# Patient Record
Sex: Male | Born: 1980
Health system: Southern US, Community
[De-identification: ages and names within clinical notes are randomized; demographics above are authoritative.]

## PROBLEM LIST (undated history)

## (undated) DIAGNOSIS — Z789 Other specified health status: Secondary | ICD-10-CM

## (undated) HISTORY — PX: NO PAST SURGERIES: SHX2092

## (undated) HISTORY — PX: OTHER SURGICAL HISTORY: SHX169

---

## 2012-09-02 ENCOUNTER — Emergency Department (HOSPITAL_COMMUNITY): Payer: Self-pay

## 2012-09-02 ENCOUNTER — Observation Stay (HOSPITAL_COMMUNITY): Payer: No Typology Code available for payment source

## 2012-09-02 ENCOUNTER — Encounter (HOSPITAL_COMMUNITY): Payer: Self-pay | Admitting: Emergency Medicine

## 2012-09-02 ENCOUNTER — Observation Stay (HOSPITAL_COMMUNITY)
Admission: EM | Admit: 2012-09-02 | Discharge: 2012-09-03 | Disposition: A | Discharge status: Home / Self Care | Payer: No Typology Code available for payment source | Attending: General Surgery | Admitting: General Surgery

## 2012-09-02 DIAGNOSIS — R109 Unspecified abdominal pain: Principal | ICD-10-CM | POA: Insufficient documentation

## 2012-09-02 DIAGNOSIS — S161XXA Strain of muscle, fascia and tendon at neck level, initial encounter: Secondary | ICD-10-CM

## 2012-09-02 DIAGNOSIS — S301XXA Contusion of abdominal wall, initial encounter: Secondary | ICD-10-CM

## 2012-09-02 DIAGNOSIS — M25579 Pain in unspecified ankle and joints of unspecified foot: Secondary | ICD-10-CM | POA: Insufficient documentation

## 2012-09-02 DIAGNOSIS — R51 Headache: Secondary | ICD-10-CM | POA: Insufficient documentation

## 2012-09-02 DIAGNOSIS — R42 Dizziness and giddiness: Secondary | ICD-10-CM | POA: Insufficient documentation

## 2012-09-02 DIAGNOSIS — Z23 Encounter for immunization: Secondary | ICD-10-CM | POA: Insufficient documentation

## 2012-09-02 DIAGNOSIS — M549 Dorsalgia, unspecified: Secondary | ICD-10-CM | POA: Insufficient documentation

## 2012-09-02 DIAGNOSIS — S139XXA Sprain of joints and ligaments of unspecified parts of neck, initial encounter: Secondary | ICD-10-CM | POA: Insufficient documentation

## 2012-09-02 HISTORY — DX: Other specified health status: Z78.9

## 2012-09-02 LAB — COMPREHENSIVE METABOLIC PANEL
AST: 34 U/L (ref 0–37)
CO2: 27 mEq/L (ref 19–32)
Calcium: 9.3 mg/dL (ref 8.4–10.5)
Creatinine, Ser: 0.88 mg/dL (ref 0.50–1.35)
GFR calc Af Amer: >90 mL/min (ref >90)
GFR calc non Af Amer: >90 mL/min (ref >90)

## 2012-09-02 LAB — URINALYSIS, ROUTINE W REFLEX MICROSCOPIC
Hgb urine dipstick: NEGATIVE
Leukocytes, UA: NEGATIVE
Nitrite: NEGATIVE
Specific Gravity, Urine: 1.037 — ABNORMAL HIGH (ref 1.005–1.030)
Urobilinogen, UA: 0.2 mg/dL (ref 0.0–1.0)

## 2012-09-02 LAB — CBC WITH DIFFERENTIAL/PLATELET
Basophils Absolute: 0 10*3/uL (ref 0.0–0.1)
Basophils Relative: 1 % (ref 0–1)
Eosinophils Relative: 1 % (ref 0–5)
HCT: 43.8 % (ref 39.0–52.0)
Lymphocytes Relative: 23 % (ref 12–46)
MCHC: 35.8 g/dL (ref 30.0–36.0)
MCV: 77.8 fL — ABNORMAL LOW (ref 78.0–100.0)
Monocytes Absolute: 0.6 10*3/uL (ref 0.1–1.0)
RDW: 13.7 % (ref 11.5–15.5)

## 2012-09-02 MED ORDER — OXYCODONE HCL 5 MG PO TABS: 10.0000 mg | ORAL_TABLET | ORAL | Status: DC | PRN

## 2012-09-02 MED ORDER — ONDANSETRON HCL 4 MG/2ML IJ SOLN
4.0000 mg | Freq: Once | INTRAMUSCULAR | Status: AC
  Administered 2012-09-02: 4 mg via INTRAVENOUS
  Filled 2012-09-02: qty 2

## 2012-09-02 MED ORDER — DEXTROSE-NACL 5-0.45 % IV SOLN
INTRAVENOUS | Status: DC
  Administered 2012-09-02 – 2012-09-03 (×2): via INTRAVENOUS

## 2012-09-02 MED ORDER — INFLUENZA VIRUS VACC SPLIT PF IM SUSP
0.5000 mL | INTRAMUSCULAR | Status: AC
  Administered 2012-09-03: 0.5 mL via INTRAMUSCULAR
  Filled 2012-09-02: qty 0.5

## 2012-09-02 MED ORDER — SODIUM CHLORIDE 0.9 % IV SOLN
Freq: Once | INTRAVENOUS | Status: AC
  Administered 2012-09-02: 16:00:00 via INTRAVENOUS

## 2012-09-02 MED ORDER — ONDANSETRON HCL 8 MG PO TABS
4.0000 mg | ORAL_TABLET | Freq: Four times a day (QID) | ORAL | Status: DC | PRN
  Filled 2012-09-02: qty 0.5

## 2012-09-02 MED ORDER — HYDROMORPHONE HCL PF 1 MG/ML IJ SOLN
1.0000 mg | INTRAMUSCULAR | Status: DC | PRN
  Administered 2012-09-02: 1 mg via INTRAVENOUS

## 2012-09-02 MED ORDER — HYDROMORPHONE HCL PF 1 MG/ML IJ SOLN
INTRAMUSCULAR | Status: AC
  Filled 2012-09-02: qty 1

## 2012-09-02 MED ORDER — ONDANSETRON HCL 4 MG/2ML IJ SOLN
4.0000 mg | Freq: Four times a day (QID) | INTRAMUSCULAR | Status: DC | PRN
  Administered 2012-09-02: 4 mg via INTRAVENOUS

## 2012-09-02 MED ORDER — KCL IN DEXTROSE-NACL 20-5-0.9 MEQ/L-%-% IV SOLN
INTRAVENOUS | Status: DC
  Filled 2012-09-02 (×2): qty 1000

## 2012-09-02 MED ORDER — IOHEXOL 300 MG/ML  SOLN
100.0000 mL | Freq: Once | INTRAMUSCULAR | Status: AC | PRN
  Administered 2012-09-02: 100 mL via INTRAVENOUS

## 2012-09-02 MED ORDER — ONDANSETRON HCL 4 MG/2ML IJ SOLN
INTRAMUSCULAR | Status: AC
  Filled 2012-09-02: qty 2

## 2012-09-02 MED ORDER — MORPHINE SULFATE 4 MG/ML IJ SOLN
4.0000 mg | Freq: Once | INTRAMUSCULAR | Status: AC
  Administered 2012-09-02: 4 mg via INTRAVENOUS
  Filled 2012-09-02: qty 1
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
## 2012-09-03 MED ORDER — HYDROCODONE-ACETAMINOPHEN 5-325 MG PO TABS: 1.0000 | ORAL_TABLET | Freq: Four times a day (QID) | ORAL | Status: DC | PRN

## 2012-09-03 MED ORDER — PROMETHAZINE HCL 12.5 MG PO TABS: 25.0000 mg | ORAL_TABLET | Freq: Four times a day (QID) | ORAL | Status: DC | PRN

## 2012-09-03 NOTE — Progress Notes (Signed)
Patient discharged to home with friend.  Discharge teaching completed with friend interpreting, including follow up care, and medications.  Verbalizes understanding with no further questions.  Vital signs stable, no complaints of pain.  Discharged per wheelchair with friend.
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
## 2012-09-03 NOTE — Discharge Summary (Signed)
Patient is stable for discharge.  This patient has been seen and I agree with the findings and treatment plan.  Marta Lamas. Gae Bon, MD, FACS [phone] (pager) [phone] (direct pager) Trauma Surgeon

## 2015-08-22 DIAGNOSIS — M549 Dorsalgia, unspecified: Secondary | ICD-10-CM | POA: Insufficient documentation

## 2016-01-12 ENCOUNTER — Encounter (HOSPITAL_BASED_OUTPATIENT_CLINIC_OR_DEPARTMENT_OTHER): Payer: Self-pay | Admitting: *Deleted

## 2016-01-12 ENCOUNTER — Emergency Department (HOSPITAL_BASED_OUTPATIENT_CLINIC_OR_DEPARTMENT_OTHER)
Admission: EM | Admit: 2016-01-12 | Discharge: 2016-01-12 | Disposition: A | Discharge status: Home / Self Care | Payer: No Typology Code available for payment source | Attending: Emergency Medicine | Admitting: Emergency Medicine

## 2016-01-12 DIAGNOSIS — J309 Allergic rhinitis, unspecified: Secondary | ICD-10-CM | POA: Insufficient documentation

## 2016-01-12 DIAGNOSIS — H1013 Acute atopic conjunctivitis, bilateral: Secondary | ICD-10-CM | POA: Insufficient documentation

## 2016-01-12 MED ORDER — OLOPATADINE HCL 0.2 % OP SOLN: 1.0000 [drp] | Freq: Every day | OPHTHALMIC | Status: DC

## 2016-01-12 MED ORDER — FLUTICASONE PROPIONATE 50 MCG/ACT NA SUSP: 2.0000 | Freq: Every day | NASAL | Status: DC

## 2016-01-12 MED FILL — SM ALLERGY RELIEF 50 MCG SP: 50 MCG | 30 days supply | Qty: 16 | Fill #0

## 2016-01-12 MED FILL — PATADAY 0.2% EYE DROPS: 0.2 | 25 days supply | Qty: 3 | Fill #0

## 2016-01-12 NOTE — Discharge Instructions (Signed)
Allergic Conjunctivitis  Allergic conjunctivitis is inflammation of the clear membrane that covers the white part of your eye and the inner surface of your eyelid (conjunctiva), and it is caused by allergies. The blood vessels in the conjunctiva become inflamed, and this causes the eye to become red or pink, and it often causes itchiness in the eye. Allergic conjunctivitis cannot be spread by one person to another person (noncontagious).  CAUSES  This condition is caused by an allergic reaction. Common causes of an allergic reaction (allergens) include:  · Dust.  · Pollen.  · Mold.  · Animal dander or secretions.  RISK FACTORS  This condition is more likely to develop if you are exposed to high levels of allergens that cause the allergic reaction. This might include being outdoors when air pollen levels are high or being around animals that you are allergic to.  SYMPTOMS  Symptoms of this condition may include:  · Eye redness.  · Tearing of the eyes.  · Watery eyes.  · Itchy eyes.  · Burning feeling in the eyes.  · Clear drainage from the eyes.  · Swollen eyelids.  DIAGNOSIS  This condition may be diagnosed by medical history and physical exam. If you have drainage from your eyes, it may be tested to rule out other causes of conjunctivitis.  TREATMENT  Treatment for this condition often includes medicines. These may be eye drops, ointments, or oral medicines. They may be prescription medicines or over-the-counter medicines.  HOME CARE INSTRUCTIONS  · Take or apply medicines only as directed by your health care provider.  · Do not touch or rub your eyes.  · Do not wear contact lenses until the inflammation is gone. Wear glasses instead.  · Do not wear eye makeup until the inflammation is gone.  · Apply a cool, clean washcloth to your eye for 10-20 minutes, 3-4 times a day.  · Try to avoid whatever allergen is causing the allergic reaction.  SEEK MEDICAL CARE IF:  · Your symptoms get worse.  · You have pus draining  from your eye.  · You have new symptoms.  · You have a fever.     This information is not intended to replace advice given to you by your health care provider. Make sure you discuss any questions you have with your health care provider.     Document Released: 12/10/2002 Document Revised: 10/10/2014 Document Reviewed: 07/01/2014  Elsevier Interactive Patient Education ©2016 [employer]    Allergic Rhinitis  Allergic rhinitis is when the mucous membranes in the nose respond to allergens. Allergens are particles in the air that cause your body to have an allergic reaction. This causes you to release allergic antibodies. Through a chain of events, these eventually cause you to release histamine into the blood stream. Although meant to protect the body, it is this release of histamine that causes your discomfort, such as frequent sneezing, congestion, and an itchy, runny nose.   CAUSES  Seasonal allergic rhinitis (hay fever) is caused by pollen allergens that may come from grasses, trees, and weeds. Year-round allergic rhinitis (perennial allergic rhinitis) is caused by allergens such as house dust mites, pet dander, and mold spores.  SYMPTOMS  · Nasal stuffiness (congestion).  · Itchy, runny nose with sneezing and tearing of the eyes.  DIAGNOSIS  Your health care provider can help you determine the allergen or allergens that trigger your symptoms. If you and your health care provider are unable to determine the allergen,   skin or blood testing may be used. Your health care provider will diagnose your condition after taking your health history and performing a physical exam. Your health care provider may assess you for other related conditions, such as asthma, pink eye, or an ear infection.  TREATMENT  Allergic rhinitis does not have a cure, but it can be controlled by:  · Medicines that block allergy symptoms. These may include allergy shots, nasal sprays, and oral antihistamines.  · Avoiding the allergen.  Hay fever  may often be treated with antihistamines in pill or nasal spray forms. Antihistamines block the effects of histamine. There are over-the-counter medicines that may help with nasal congestion and swelling around the eyes. Check with your health care provider before taking or giving this medicine.  If avoiding the allergen or the medicine prescribed do not work, there are many new medicines your health care provider can prescribe. Stronger medicine may be used if initial measures are ineffective. Desensitizing injections can be used if medicine and avoidance does not work. Desensitization is when a patient is given ongoing shots until the body becomes less sensitive to the allergen. Make sure you follow up with your health care provider if problems continue.  HOME CARE INSTRUCTIONS  It is not possible to completely avoid allergens, but you can reduce your symptoms by taking steps to limit your exposure to them. It helps to know exactly what you are allergic to so that you can avoid your specific triggers.  SEEK MEDICAL CARE IF:  · You have a fever.  · You develop a cough that does not stop easily (persistent).  · You have shortness of breath.  · You start wheezing.  · Symptoms interfere with normal daily activities.     This information is not intended to replace advice given to you by your health care provider. Make sure you discuss any questions you have with your health care provider.     Document Released: 06/14/2001 Document Revised: 10/10/2014 Document Reviewed: 05/27/2013  Elsevier Interactive Patient Education ©2016 [employer]

## 2016-01-12 NOTE — ED Notes (Signed)
Eyes are red and itching with drainage. Stuffy nose.

## 2016-01-12 NOTE — ED Provider Notes (Signed)
CSN: [id number]     Arrival date & time 01/12/16  1504 History   First MD Initiated Contact with Patient 01/12/16 1629     Chief Complaint  Patient presents with  . Eye Problem   HPI  Mr. Brandon Rice is a 35 year old male presenting with eye itching and drainage. Onset of symptoms was 2 weeks ago but has worsened in the past 5 days. He reports eye redness and watery drainage. He has had morning crusting. He endorses a sensation of itching behind his eyelids. He has tried OTC eye drops without relief of symptoms. He also endorses nasal congestion, rhinorrhea and throat irritation. He denies purulent nasal discharge. He describes his throat as feeling dry and scratchy. Denies pain on swallowing. He denies a history of seasonal allergies. He does not have a PCP. He has no other complaints today.   Past Medical History  Diagnosis Date  . No pertinent past medical history    Past Surgical History  Procedure Laterality Date  . No past surgeries     No family history on file. Social History  Substance Use Topics  . Smoking status: Never Smoker   . Smokeless tobacco: Never Used  . Alcohol Use: No    Review of Systems  Constitutional: Negative for fever and chills.  HENT: Positive for congestion, rhinorrhea and sore throat. Negative for ear pain, postnasal drip and trouble swallowing.   Eyes: Positive for discharge, redness and itching. Negative for pain and visual disturbance.  Respiratory: Negative for cough, shortness of breath and wheezing.   Cardiovascular: Negative for chest pain.  Gastrointestinal: Negative for nausea, vomiting and abdominal pain.  All other systems reviewed and are negative.     Allergies  Review of patient's allergies indicates no known allergies.  Home Medications   Prior to Admission medications   Medication Sig Start Date End Date Taking? Authorizing Provider  fluticasone (FLONASE) 50 MCG/ACT nasal spray Place 2 sprays into both nostrils daily. 01/12/16    Nilam Quakenbush, PA-C  HYDROcodone-acetaminophen (NORCO) 5-325 MG per tablet Take 1 tablet by mouth every 6 (six) hours as needed for pain. 09/03/12   Nonie HoyerMegan N Baird, PA-C  Olopatadine HCl 0.2 % SOLN Apply 1 drop to eye daily. 01/12/16   Lorrie Gargan, PA-C  promethazine (PHENERGAN) 12.5 MG tablet Take 2 tablets (25 mg total) by mouth every 6 (six) hours as needed for nausea. 09/03/12   Megan N Baird, PA-C   BP 125/85 mmHg  Pulse 68  Temp(Src) 98.8 F (37.1 C) (Oral)  Resp 20  Ht 6' (1.829 m)  Wt 80.74 kg  BMI 24.14 kg/m2  SpO2 98% Physical Exam  Constitutional: He appears well-developed and well-nourished. No distress.  HENT:  Head: Normocephalic and atraumatic.  Nose: Nose normal. No mucosal edema or rhinorrhea. Right sinus exhibits no maxillary sinus tenderness and no frontal sinus tenderness. Left sinus exhibits no maxillary sinus tenderness and no frontal sinus tenderness.  Mouth/Throat: Uvula is midline and oropharynx is clear and moist. No oropharyngeal exudate.  Eyes: EOM are normal. Pupils are equal, round, and reactive to light. Right eye exhibits discharge. Right eye exhibits no chemosis. Left eye exhibits discharge. Left eye exhibits no chemosis. Right conjunctiva is injected. Left conjunctiva is injected. No scleral icterus.  Slit lamp exam:      The right eye shows no hyphema.       The left eye shows no hyphema.  Bilateral conjunctival injection with watery discharge. No mucopurulent discharge. No eyelash  crusting  Neck: Normal range of motion. Neck supple.  Cardiovascular: Normal rate, regular rhythm and normal heart sounds.   Pulmonary/Chest: Effort normal and breath sounds normal. No respiratory distress. He has no wheezes.  Musculoskeletal: Normal range of motion.  Lymphadenopathy:    He has no cervical adenopathy.  Neurological: He is alert. Coordination normal.  Skin: Skin is warm and dry.  Psychiatric: He has a normal mood and affect. His behavior is normal.  Nursing  note and vitals reviewed.   ED Course  Procedures (including critical care time) Labs Review Labs Reviewed - No data to display  Imaging Review No results found. I have personally reviewed and evaluated these images and lab results as part of my medical decision-making.   EKG Interpretation None      MDM   Final diagnoses:  Allergic conjunctivitis, bilateral  Allergic rhinitis, unspecified allergic rhinitis type   35 year old male presenting with bilateral eye itching and watery discharge and nasal congestion and rhinorrhea 2 weeks. Afebrile nontoxic appearing. Bilateral conjunctival injection with watery discharge noted. No mucopurulent discharge or eyelash crusting. No nasal congestion or sinus tenderness to palpation. Oropharynx is clear and moist without erythema or exudate. Presentation consistent with allergic conjunctivitis and allergic rhinitis likely secondary to increased pollen from the change of season. Will discharge with Pataday and Flonase. Patient is to follow up with a PCP as needed. I also discussed over-the-counter medications such as Claritin or Benadryl. Return precautions given in discharge paperwork and discussed with pt at bedside. Pt stable for discharge     Alveta Heimlich, PA-C 01/12/16 1711  Geoffery Lyons, MD 01/12/16 2315

## 2017-01-19 ENCOUNTER — Emergency Department (HOSPITAL_COMMUNITY)
Admission: EM | Admit: 2017-01-19 | Discharge: 2017-01-19 | Disposition: A | Discharge status: Home / Self Care | Payer: BLUE CROSS/BLUE SHIELD | Attending: Emergency Medicine | Admitting: Emergency Medicine

## 2017-01-19 DIAGNOSIS — Z79899 Other long term (current) drug therapy: Secondary | ICD-10-CM | POA: Insufficient documentation

## 2017-01-19 DIAGNOSIS — H578 Other specified disorders of eye and adnexa: Secondary | ICD-10-CM | POA: Diagnosis present

## 2017-01-19 DIAGNOSIS — H1033 Unspecified acute conjunctivitis, bilateral: Secondary | ICD-10-CM

## 2017-01-19 DIAGNOSIS — J302 Other seasonal allergic rhinitis: Secondary | ICD-10-CM

## 2017-01-19 MED ORDER — ERYTHROMYCIN 5 MG/GM OP OINT: 1.0000 "application " | TOPICAL_OINTMENT | Freq: Four times a day (QID) | OPHTHALMIC | 0 refills | Status: AC

## 2017-01-19 MED ORDER — FLUTICASONE PROPIONATE 50 MCG/ACT NA SUSP: 2.0000 | Freq: Every day | NASAL | 0 refills | Status: DC

## 2017-01-19 MED ORDER — LEVOCETIRIZINE DIHYDROCHLORIDE 5 MG PO TABS: 5.0000 mg | ORAL_TABLET | Freq: Every evening | ORAL | 0 refills | Status: DC

## 2017-01-19 NOTE — ED Notes (Signed)
Patient d/c;d self care.  f/U and medications reviewed.  Patient verbalized understanding. 

## 2017-01-19 NOTE — Discharge Instructions (Signed)
Try to avoid rubbing eyes. Apply cool compresses and use prescribed eye drops and ointments as directed. Alternate between tylenol and motrin as needed for pain. Use flonase as directed for nasal congestion and allergy relief. Take xyzal as directed for allergy relief. Follow up with your regular doctor in 2-3 days for recheck of symptoms. Return to the ER for changes or worsening symptoms.

## 2017-01-19 NOTE — ED Provider Notes (Signed)
[facility]-EMERGENCY DEPT Provider Note   CSN: [id number] Arrival date & time: 01/19/17  1313  By signing my name below, I, Brandon Rice, attest that this documentation has been prepared under the direction and in the presence of [address] [employer]. Electronically Signed: Teofilo Rice, ED Scribe. 01/19/2017. 2:00 PM.    History   Chief Complaint Chief Complaint  Patient presents with  . Conjunctivitis    The history is provided by the patient and medical records. No language interpreter was used.  Conjunctivitis  This is a recurrent problem. The current episode started more than 1 week ago. The problem occurs constantly. The problem has not changed since onset.Pertinent negatives include no chest pain, no abdominal pain and no shortness of breath. Exacerbated by: going outside. Nothing relieves the symptoms. Treatments tried: pataday, prednisone  x5 days, and zyrtec. The treatment provided no relief.   HPI Comments:  Brandon Rice is a 36 y.o. male with a PMHx of seasonal allergies, who presents to the Emergency Department complaining of constant bilateral eye itching, redness, and watery drainage x 3 weeks. He states that his eyes have been persistently watery for 3 weeks since the seasons changed, reports that the same thing happened last year and it seems to be due to seasonal allergies. Pt complains of associated green crusting to the eyes, eye redness and itching, b/l ear congestion, nasal congestion, and rhinorrhea as well. Pt was seen 10 days ago (on 01/09/17) by his PCP Dr. Celene Skeen and was prescribed Pataday eye drops and prednisone  daily for 5 days. These medications provided no relief. He was seen last year in the ED and was given pataday and flonase with some relief. He's been using zyrtec as well without significant relief. Going outside seems to aggravate his symptoms. Pt does not wear corrective lenses or contacts. He denies any recent swimming in a pool or any sick  contacts. He denies ear discharge, sore throat, vision changes, fevers, chills, CP, SOB, abd pain, N/V/D/C, hematuria, dysuria, myalgias, arthralgias, numbness, tingling, focal weakness, or any other complaints at this time.    Past Medical History:  Diagnosis Date  . No pertinent past medical history     There are no active problems to display for this patient.   Past Surgical History:  Procedure Laterality Date  . NO PAST SURGERIES         Home Medications    Prior to Admission medications   Medication Sig Start Date End Date Taking? Authorizing Provider  fluticasone (FLONASE) 50 MCG/ACT nasal spray Place 2 sprays into both nostrils daily. 01/12/16   Stevi Barrett, PA-C  HYDROcodone-acetaminophen (NORCO) 5-325 MG per tablet Take 1 tablet by mouth every 6 (six) hours as needed for pain. 09/03/12   Nonie Hoyer, PA-C  Olopatadine HCl 0.2 % SOLN Apply 1 drop to eye daily. 01/12/16   Stevi Barrett, PA-C  promethazine (PHENERGAN) 12.5 MG tablet Take 2 tablets (25 mg total) by mouth every 6 (six) hours as needed for nausea. 09/03/12   Nonie Hoyer, PA-C    Family History No family history on file.  Social History Social History  Substance Use Topics  . Smoking status: Never Smoker  . Smokeless tobacco: Never Used  . Alcohol use No     Allergies   Patient has no known allergies.   Review of Systems Review of Systems  Constitutional: Negative for chills and fever.  HENT: Positive for congestion, ear pain (congested feeling) and rhinorrhea. Negative for  ear discharge and sore throat.   Eyes: Positive for discharge (watery), redness and itching. Negative for photophobia, pain and visual disturbance.  Respiratory: Negative for shortness of breath.   Cardiovascular: Negative for chest pain.  Gastrointestinal: Negative for abdominal pain, constipation, diarrhea, nausea and vomiting.  Genitourinary: Negative for dysuria and hematuria.  Musculoskeletal: Negative for arthralgias  and myalgias.  Skin: Negative for color change.  Allergic/Immunologic: Positive for environmental allergies. Negative for immunocompromised state.  Neurological: Negative for weakness and numbness.  Psychiatric/Behavioral: Negative for confusion.   All systems reviewed and are negative for acute change except as noted in the HPI.   Physical Exam Updated Vital Signs BP 135/86 (BP Location: Left Arm)   Pulse 65   Temp 97.9 F (36.6 C) (Oral)   Resp 16   SpO2 100%   Physical Exam  Constitutional: He is oriented to person, place, and time. Vital signs are normal. He appears well-developed and well-nourished.  Non-toxic appearance. No distress.  Afebrile, nontoxic, NAD  HENT:  Head: Normocephalic and atraumatic.  Right Ear: Hearing, tympanic membrane, external ear and ear canal normal.  Left Ear: Hearing, tympanic membrane, external ear and ear canal normal.  Nose: Mucosal edema and rhinorrhea present.  Mouth/Throat: Uvula is midline, oropharynx is clear and moist and mucous membranes are normal. No trismus in the jaw. No uvula swelling.  Ears are clear bilaterally. Nose with mucosal edema and clear rhinorrhea. Oropharynx clear and moist, without uvular swelling or deviation, no trismus or drooling, no tonsillar swelling or erythema, no exudates.   Eyes: EOM are normal. Pupils are equal, round, and reactive to light. Right eye exhibits no discharge. Left eye exhibits no discharge. Right conjunctiva is injected. Right conjunctiva has no hemorrhage. Left conjunctiva is injected. Left conjunctiva has no hemorrhage.  Bilateral conjunctival injection including perilimbal area, no photophobia or consensual eye pain during exam, no occular drainage noted. PERRL, EOMI, no nystagmus, no visual field deficits.   Neck: Normal range of motion. Neck supple.  Cardiovascular: Normal rate and intact distal pulses.   Pulmonary/Chest: Effort normal. No respiratory distress.  Abdominal: Normal appearance. He  exhibits no distension.  Musculoskeletal: Normal range of motion.  Neurological: He is alert and oriented to person, place, and time. He has normal strength. No sensory deficit.  Skin: Skin is warm, dry and intact. No rash noted.  Psychiatric: He has a normal mood and affect.  Nursing note and vitals reviewed.    ED Treatments / Results  DIAGNOSTIC STUDIES:  Oxygen Saturation is 100% on RA, normal by my interpretation.    COORDINATION OF CARE:  2:00 PM Discussed treatment plan with pt at bedside and pt agreed to plan.   Labs (all labs ordered are listed, but only abnormal results are displayed) Labs Reviewed - No data to display  EKG  EKG Interpretation None       Radiology No results found.  Procedures Procedures (including critical care time)  Medications Ordered in ED Medications - No data to display   Initial Impression / Assessment and Plan / ED Course  I have reviewed the triage vital signs and the nursing notes.  Pertinent labs & imaging results that were available during my care of the patient were reviewed by me and considered in my medical decision making (see chart for details).     36 y.o. male here with b/l eye redness and itching/watery drainage x3 wks. Took prednisone and using pataday without significant relief. Last year came to ED for  same symptoms, given flonase and pataday with relief; seems likely seasonal allergies. On exam, b/l conjunctival injection, including perilimbal region, no s/sx of uveitis. No drainage noted. Likely allergic conjunctivitis however pt states he's had some crusting in the mornings therefore will cover for bacterial conjunctivitis as well. Advised use of cool compresses, continuation of pataday, and will rx xyzal for improved antihistamine coverage, as well as flonase. Advised other OTC remedies for symptoms. F/up with PCP in 2-3 days for recheck. I explained the diagnosis and have given explicit precautions to return to the  ER including for any other new or worsening symptoms. The patient understands and accepts the medical plan as it's been dictated and I have answered their questions. Discharge instructions concerning home care and prescriptions have been given. The patient is STABLE and is discharged to home in good condition.   I personally performed the services described in this documentation, which was scribed in my presence. The recorded information has been reviewed and is accurate.    Final Clinical Impressions(s) / ED Diagnoses   Final diagnoses:  Acute conjunctivitis of both eyes, unspecified acute conjunctivitis type  Seasonal allergic rhinitis, unspecified trigger    New Prescriptions New Prescriptions   ERYTHROMYCIN OPHTHALMIC OINTMENT    Place 1 application into both eyes 4 (four) times daily. Apply thin 1/2 inch ribbon to lower eyelid every 6 hours x 7 days   FLUTICASONE (FLONASE) 50 MCG/ACT NASAL SPRAY    Place 2 sprays into both nostrils daily.   LEVOCETIRIZINE (XYZAL) 5 MG TABLET    Take 1 tablet (5 mg total) by mouth every evening.     [address] PA-C 01/19/17 1411    Lavera Guise, MD 01/19/17 2026

## 2017-01-19 NOTE — ED Triage Notes (Signed)
Pt c/o bilateral pruritic red eyes with clear drainage x 20 days. Told symptoms were due to allergies on 01/09/17, prescribed olopatadine hcl eye drops, prednisone 20 mg tablets BID x 5 days.  Conjunctiva swollen, bright red. No pain with pupillary response to light. No vision change.

## 2019-03-04 ENCOUNTER — Emergency Department (HOSPITAL_COMMUNITY)
Admission: EM | Admit: 2019-03-04 | Discharge: 2019-03-04 | Disposition: A | Discharge status: Home / Self Care | Payer: BLUE CROSS/BLUE SHIELD | Attending: Emergency Medicine | Admitting: Emergency Medicine

## 2019-03-04 ENCOUNTER — Other Ambulatory Visit: Payer: Self-pay

## 2019-03-04 ENCOUNTER — Encounter (HOSPITAL_COMMUNITY): Payer: Self-pay

## 2019-03-04 ENCOUNTER — Emergency Department (HOSPITAL_COMMUNITY): Payer: BLUE CROSS/BLUE SHIELD

## 2019-03-04 DIAGNOSIS — Z79899 Other long term (current) drug therapy: Secondary | ICD-10-CM | POA: Insufficient documentation

## 2019-03-04 DIAGNOSIS — R51 Headache: Secondary | ICD-10-CM | POA: Insufficient documentation

## 2019-03-04 DIAGNOSIS — R519 Headache, unspecified: Secondary | ICD-10-CM

## 2019-03-04 NOTE — ED Notes (Signed)
Patient given discharge teaching and verbalized understanding. Patient ambulated out of ED with a steady gait. 

## 2019-03-04 NOTE — ED Notes (Signed)
Patient transported to CT 

## 2019-03-04 NOTE — Discharge Instructions (Signed)
Continue taking tylenol to help with headache.   Follow-up with [facility] to establish a primary care doctor if you do not have one.   Return to the Emergency Dept for any worsening headache, nausea/vomiting, difficulty breathing, vision changes, numbness/weakness of your arms or legs, difficulty walking, fever or any other worsening or concerning symptoms.

## 2019-03-04 NOTE — ED Provider Notes (Signed)
[facility]-EMERGENCY DEPT Provider Note   CSN: [id number] Arrival date & time: 03/04/19  4098    History   Chief Complaint Chief Complaint  Patient presents with   Headache    HPI [facility] is a 38 y.o. male with no significant past medical history.  Presents for evaluation of intermittent headache x3 days.  Patient states that the headache first began about 3 days ago and states that it was gradual in nature and then progressively worsened.  He states it started out at about a 1 and then progressed to about a 3 or 4.  He denies any thunderclap headache.  He states he did not have any preceding trauma, injury, fall.  He reports taking Tylenol with improvement in headache.  He states that over the last 3 days, the headache has continued to come back but not as bad.  He states it is always in the right side of his head.  He states right now it is currently at 1 out of 10. He states he came in to make sure it wasn't coronavirus. He states that there are no alleviating or aggravating factors.  He has not noted any fevers, nausea/vomiting, vision changes, numbness/weakness of his arms or legs, nausea/vomiting, abdominal pain.  Patient states that he was concerned because he does not normally get headaches.  He states that he works at [employer] and states he is concerned that he may have been exposed to COVID-19.  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     The history is provided by the patient.    Past Medical History:  Diagnosis Date   No pertinent past medical history     There are no active problems to display for this patient.   Past Surgical History:  Procedure Laterality Date   abscess  surgery     NO PAST SURGERIES          Home Medications    Prior to Admission medications   Medication Sig Start Date End Date Taking? Authorizing Provider  acetaminophen (TYLENOL) 325 MG tablet Take 650 mg by mouth every 6 (six) hours as needed for mild pain or headache.   Yes [provider]  fluticasone (FLONASE) 50 MCG/ACT nasal spray Place 2 sprays into both nostrils daily. Patient not taking: Reported on 03/04/2019 01/12/16   Barrett, Rolm Gala, PA-C  fluticasone (FLONASE) 50 MCG/ACT nasal spray Place 2 sprays into both nostrils daily. Patient not taking: Reported on 03/04/2019 01/19/17   Street, [place], PA-C  HYDROcodone-acetaminophen (NORCO) 5-325 MG per tablet Take 1 tablet by mouth every 6 (six) hours as needed for pain. Patient not taking: Reported on 03/04/2019 09/03/12   Nonie Hoyer, PA-C  levocetirizine (XYZAL) 5 MG tablet Take 1 tablet (5 mg total) by mouth every evening. Patient not taking: Reported on 03/04/2019 01/19/17   Street, [place], PA-C  Olopatadine HCl 0.2 % SOLN Apply 1 drop to eye daily. Patient not taking: Reported on 03/04/2019 01/12/16   Barrett, Rolm Gala, PA-C  promethazine (PHENERGAN) 12.5 MG tablet Take 2 tablets (25 mg total) by mouth every 6 (six) hours as needed for nausea. Patient not taking: Reported on 03/04/2019 09/03/12   Nonie Hoyer, PA-C  Family History Family History  Problem Relation Age of Onset   Diabetes Father     Social History Social History   Tobacco Use   Smoking status: Never Smoker   Smokeless tobacco: Never Used  Substance Use Topics   Alcohol use: No   Drug use: No     Allergies   Patient has no known allergies.   Review of Systems Review of Systems  Constitutional: Negative for fever.  HENT: Negative for congestion.   Eyes: Negative for visual disturbance.  Respiratory: Negative for shortness of breath.   Cardiovascular: Negative for chest pain.  Gastrointestinal: Negative for abdominal pain, nausea and  vomiting.  Neurological: Positive for headaches. Negative for weakness and numbness.  All other systems reviewed and are negative.    Physical Exam Updated Vital Signs BP 122/80 (BP Location: Left Arm)    Pulse 69    Temp 99 F (37.2 C) (Oral)    Resp 13    Ht 6' (1.829 m)    Wt 83 kg    SpO2 100%    BMI 24.82 kg/m   Physical Exam Vitals signs and nursing note reviewed.  Constitutional:      Appearance: Normal appearance. He is well-developed.  HENT:     Head: Normocephalic and atraumatic.     Comments: No tenderness to palpation of skull. No deformities or crepitus noted. No open wounds, abrasions or lacerations.      Mouth/Throat:     Dentition: Normal dentition. No dental abscesses.  Eyes:     General: Lids are normal.     Conjunctiva/sclera: Conjunctivae normal.     Pupils: Pupils are equal, round, and reactive to light.     Comments: PERRL. EOMs intact.   Neck:     Musculoskeletal: Full passive range of motion without pain and neck supple.     Comments: Full flexion/extension and lateral movement of neck fully intact. No bony midline tenderness. No deformities or crepitus. Neck is supple and without rigidity.  Cardiovascular:     Rate and Rhythm: Normal rate and regular rhythm.     Pulses: Normal pulses.     Heart sounds: Normal heart sounds. No murmur. No friction rub. No gallop.   Pulmonary:     Effort: Pulmonary effort is normal.     Breath sounds: Normal breath sounds.  Abdominal:     Palpations: Abdomen is soft. Abdomen is not rigid.     Tenderness: There is no abdominal tenderness. There is no guarding.  Musculoskeletal: Normal range of motion.  Skin:    General: Skin is warm and dry.     Capillary Refill: Capillary refill takes less than 2 seconds.  Neurological:     Mental Status: He is alert and oriented to person, place, and time.     Comments: Cranial nerves III-XII intact Follows commands, Moves all extremities  5/5 strength to BUE and BLE  Sensation  intact throughout all major nerve distributions Normal coordination No gait abnormalities  No slurred speech. No facial droop.   Psychiatric:        Speech: Speech normal.      ED Treatments / Results  Labs (all labs ordered are listed, but only abnormal results are displayed) Labs Reviewed - No data to display  EKG None  Radiology Ct Head Wo Contrast  Result Date: 03/04/2019 CLINICAL DATA:  Headache EXAM: CT HEAD WITHOUT CONTRAST TECHNIQUE: Contiguous axial images were obtained from the base of the skull through the vertex without intravenous  contrast. COMPARISON:  09/02/2012 FINDINGS: Brain: No evidence of acute infarction, hemorrhage, hydrocephalus, extra-axial collection or mass lesion/mass effect. Vascular: No hyperdense vessel or unexpected calcification. Skull: Normal. Negative for fracture or focal lesion. Sinuses/Orbits: No acute finding. Other: None. IMPRESSION: No acute intracranial pathology. No non-contrast CT findings to explain headache. Electronically Signed   By: Lauralyn Primes M.D.   On: 03/04/2019 08:45    Procedures Procedures (including critical care time)  Medications Ordered in ED Medications - No data to display   Initial Impression / Assessment and Plan / ED Course  I have reviewed the triage vital signs and the nursing notes.  Pertinent labs & imaging results that were available during my care of the patient were reviewed by me and considered in my medical decision making (see chart for details).        38 year old male who presents for evaluation of right-sided headache x3 days.  Reports it is been intermittent.  No preceding trauma, injury.  He states he does not have a history of headaches.  He denies any vision changes, nausea/vomiting, numbness/weakness.  No fevers.  He is concerned that this may be COVID-19 as he is working in [employer].  He has not had any known COVID 19 exposure. Patient is afebrile, non-toxic appearing, sitting comfortably on  examination table. Vital signs reviewed and stable.  No neuro deficits on exam.  No indication that this is odontogenic in etiology.  History/physical exam not concerning for CVA, meningitis, dural venous thrombosis.  At this time, low suspicion for COVID-19.  At this time, he does not meet criteria for COVID-19 testing.  Given that he has no history of headaches, will plan for CT head for evaluation of intracranial mass.   CT head negative for any acute intracranial mass or abnormality.   Discussed results with patient. He is sitting comfortably on the bed without any signs of distress. I discussed with him that at this time he does not meet any criteria for COVID-19 testing and does not have any symptoms that would be concerning for COVID-19. At this time, patient exhibits no emergent life-threatening condition that require further evaluation in ED or admission.  Plan for outpatient follow-up with [facility]. Patient had ample opportunity for questions and discussion. All patient's questions were answered with full understanding. Strict return precautions discussed. Patient expresses understanding and agreement to plan.   Portions of this note were generated with Scientist, clinical (histocompatibility and immunogenetics). Dictation errors may occur despite best attempts at proofreading.   Final Clinical Impressions(s) / ED Diagnoses   Final diagnoses:  Acute nonintractable headache, unspecified headache type    ED Discharge Orders    None       Maxwell Caul, PA-C 03/04/19 1052    Mancel Bale, MD 03/04/19 1726

## 2019-03-04 NOTE — ED Triage Notes (Signed)
Patient c/o headache x 2 days. Patient denies any N/V or blurred vision. 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 I was worried about Coronavirus.

## 2021-02-14 IMAGING — CT CT HEAD WITHOUT CONTRAST
2 of 4 series · 14 of 47 positions shown, 17 images · non-contrast
Comparison: 09/02/2012

CLINICAL DATA: Headache

EXAM:
CT HEAD WITHOUT CONTRAST
TECHNIQUE: Contiguous axial images were obtained from the base of the skull
through the vertex without intravenous contrast.

[Series 4: coronal soft tissue · coronal · 0.35mm/px · 3 of 67 slices shown]
[im 23/67  brain]
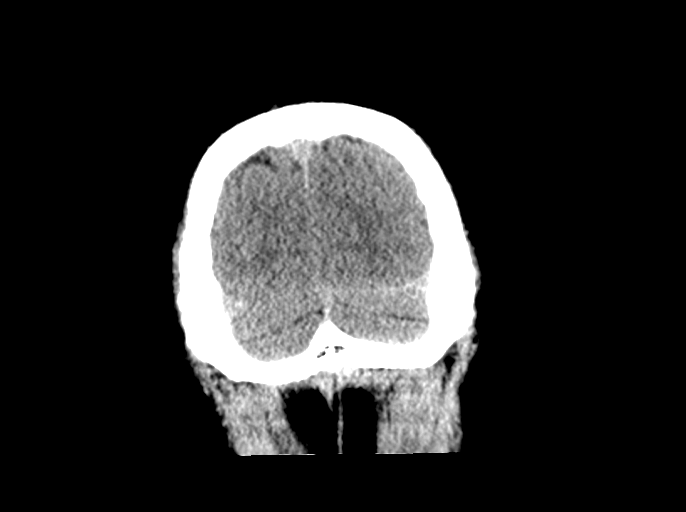
[im 30/67  brain]
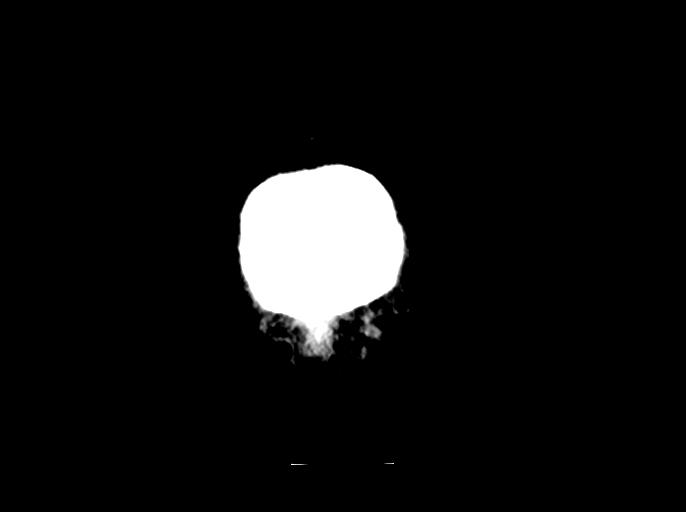
[im 37/67  brain]
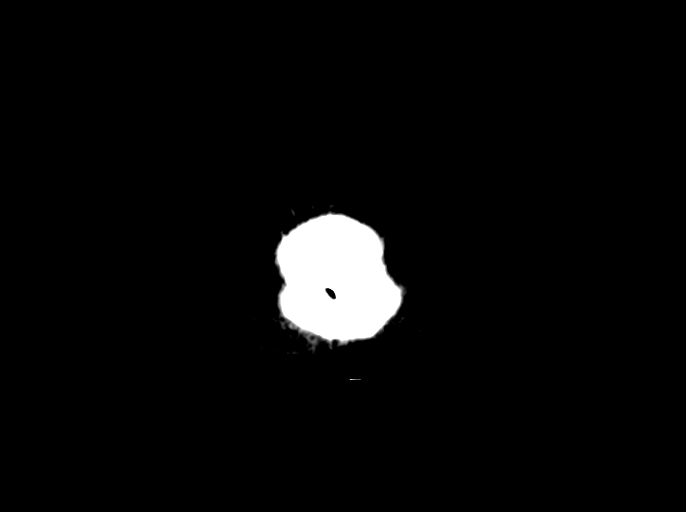

[Series 7: ang.axials soft tissue · axial · 0.42mm/px · z∈[-207,-82]mm · 11 of 48 slices shown, 14 images]
[im 3/48  brain]
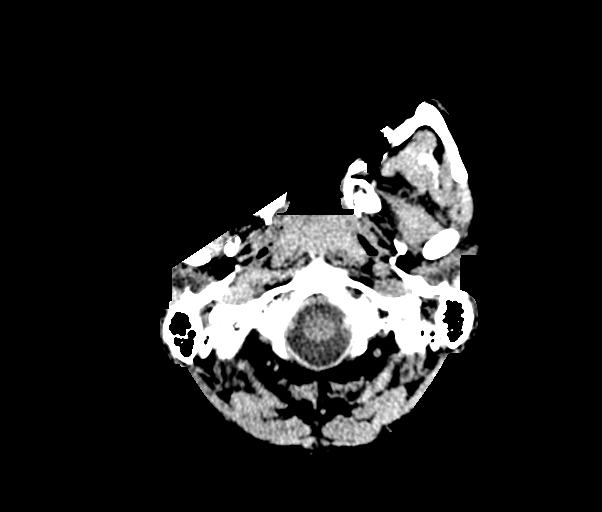
[im 3/48  bone]
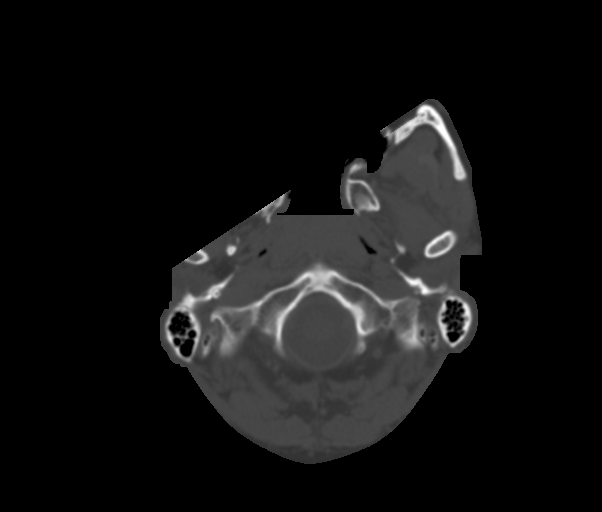
[im 8/48  brain]
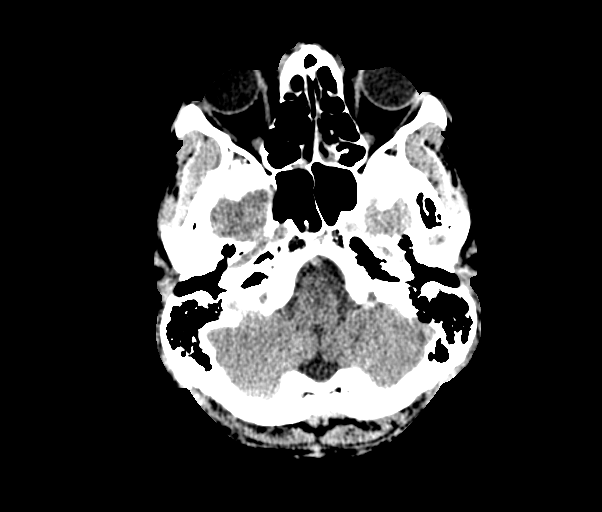
[im 11/48  brain]
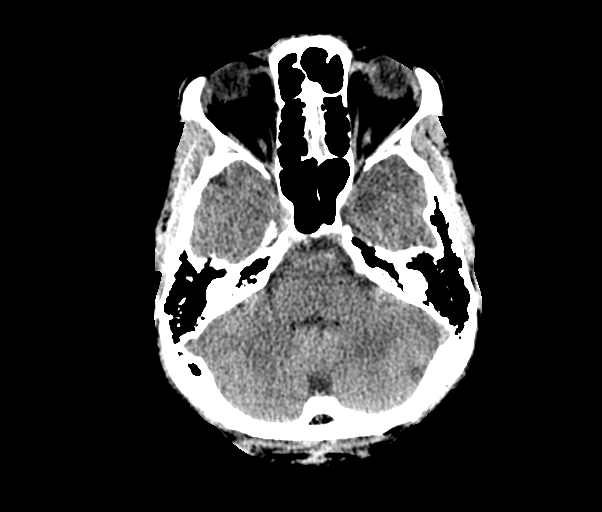
[im 16/48  brain]
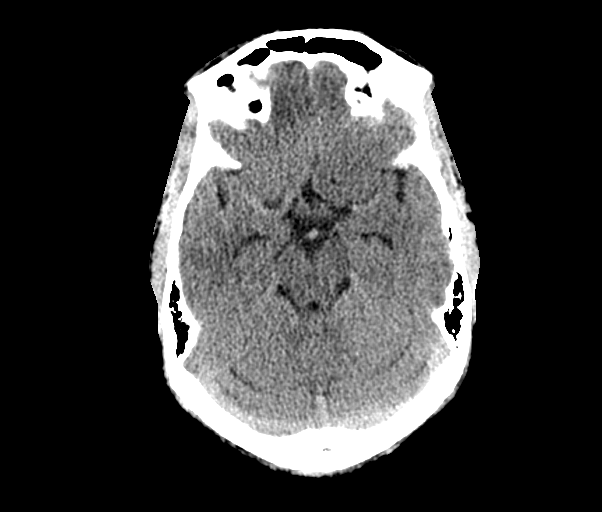
[im 19/48  brain]
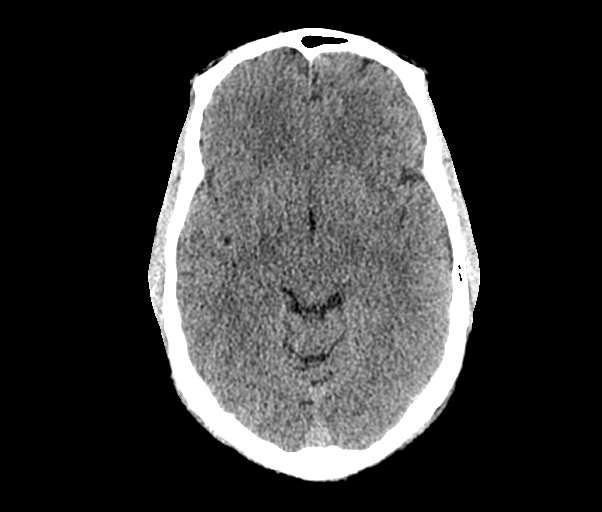
[im 19/48  bone]
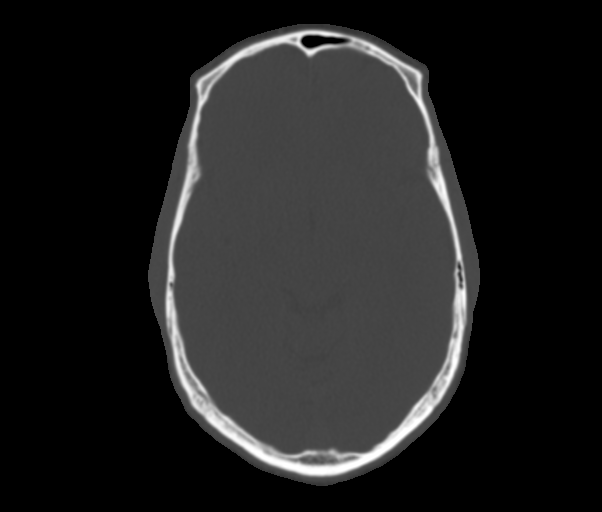
[im 24/48  brain]
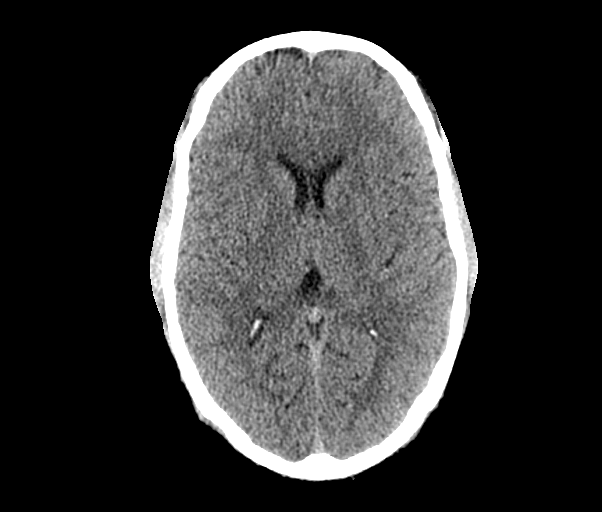
[im 29/48  brain]
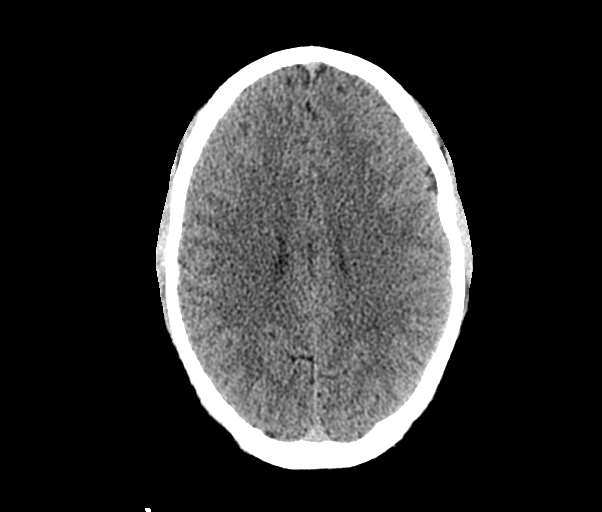
[im 32/48  brain]
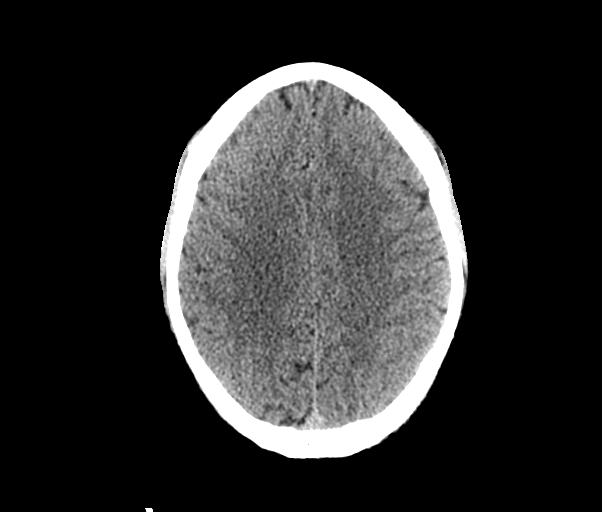
[im 37/48  brain]
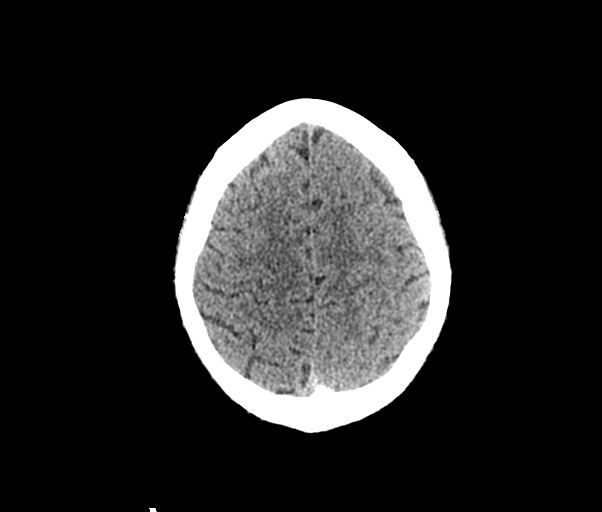
[im 37/48  bone]
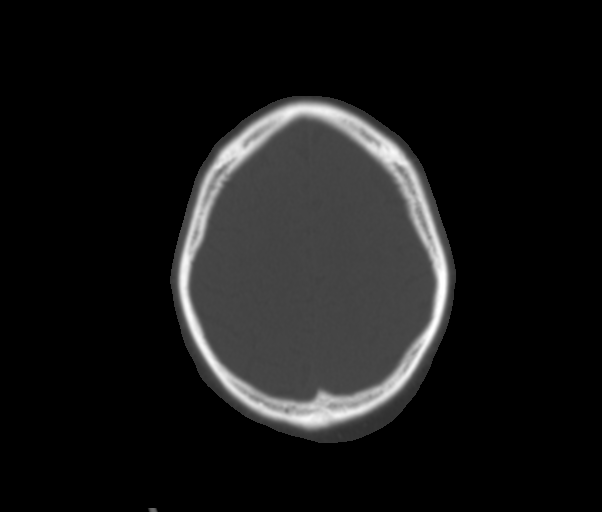
[im 40/48  brain]
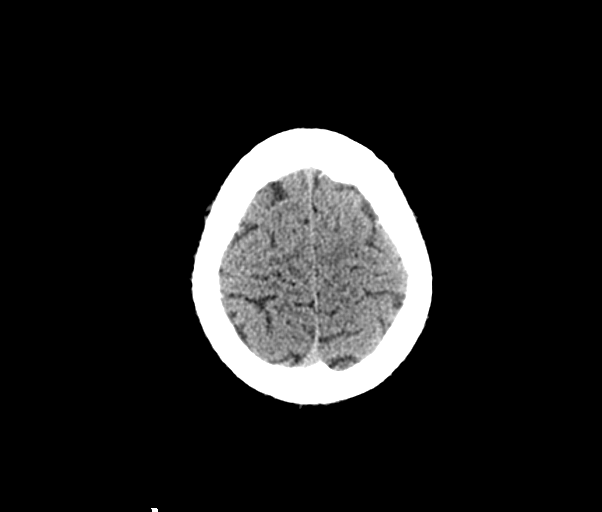
[im 45/48  brain]
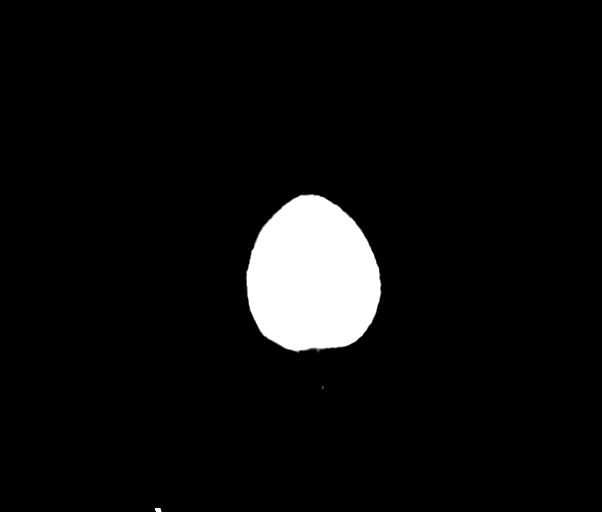

[14 of 47 positions shown; findings below may reference images not displayed]

FINDINGS: Brain: No evidence of acute infarction, hemorrhage, hydrocephalus,
extra-axial collection or mass lesion/mass effect.

Vascular: No hyperdense vessel or unexpected calcification.

Skull: Normal. Negative for fracture or focal lesion.

Sinuses/Orbits: No acute finding.

Other: None.
IMPRESSION: No acute intracranial pathology. No non-contrast CT findings to
explain headache.

## 2021-09-01 ENCOUNTER — Ambulatory Visit: Payer: Self-pay | Attending: Obstetrics and Gynecology | Admitting: *Deleted

## 2021-09-01 ENCOUNTER — Ambulatory Visit: Payer: Self-pay | Admitting: *Deleted

## 2021-09-01 ENCOUNTER — Other Ambulatory Visit: Payer: Self-pay

## 2021-10-01 ENCOUNTER — Other Ambulatory Visit: Payer: Self-pay

## 2023-12-07 ENCOUNTER — Ambulatory Visit
Admission: EM | Admit: 2023-12-07 | Discharge: 2023-12-07 | Disposition: A | Discharge status: Home / Self Care | Attending: Family Medicine | Admitting: Family Medicine

## 2023-12-07 DIAGNOSIS — B349 Viral infection, unspecified: Secondary | ICD-10-CM | POA: Diagnosis not present

## 2023-12-07 DIAGNOSIS — R519 Headache, unspecified: Secondary | ICD-10-CM | POA: Insufficient documentation

## 2023-12-07 MED ORDER — CETIRIZINE HCL 10 MG PO TABS: 10.0000 mg | ORAL_TABLET | Freq: Every day | ORAL | 0 refills | Status: AC

## 2023-12-07 MED ORDER — IBUPROFEN 600 MG PO TABS: 600.0000 mg | ORAL_TABLET | Freq: Four times a day (QID) | ORAL | 0 refills | Status: AC | PRN

## 2023-12-07 MED ORDER — PROMETHAZINE-DM 6.25-15 MG/5ML PO SYRP: 5.0000 mL | ORAL_SOLUTION | Freq: Three times a day (TID) | ORAL | 0 refills | Status: AC | PRN

## 2023-12-07 MED ORDER — PSEUDOEPHEDRINE HCL 30 MG PO TABS: 30.0000 mg | ORAL_TABLET | Freq: Three times a day (TID) | ORAL | 0 refills | Status: AC | PRN

## 2023-12-07 NOTE — ED Provider Notes (Signed)
 [facility] - [facility]  Note:  This document was prepared using Conservation officer, historic buildings and may include unintentional dictation errors.  MRN: [id number] DOB: 03-15-1981  Subjective:   [facility] is a 43 y.o. male presenting for 2-day history of coughing, intermittent headaches, mild malaise and fatigue.  His employer requires a COVID PCR test.  No chest pain, shortness of breath or wheezing.  No sinus symptoms.  No throat pain.  No smoking of any kind including cigarettes, cigars, vaping, marijuana use.    No current facility-administered medications for this encounter.  Current Outpatient Medications:    IBUPROFEN PO, Take by mouth., Disp: , Rfl:    No Known Allergies  Past Medical History:  Diagnosis Date   No pertinent past medical history      Past Surgical History:  Procedure Laterality Date   abscess surgery     NO PAST SURGERIES      Family History  Problem Relation Age of Onset   Diabetes Father     Social History   Tobacco Use   Smoking status: Never   Smokeless tobacco: Never  Vaping Use   Vaping status: Never Used  Substance Use Topics   Alcohol use: No   Drug use: No    ROS   Objective:   Vitals: BP 124/74 (BP Location: Left Arm)   Pulse 66   Temp 98.2 F (36.8 C) (Oral)   Resp 17   Ht 5\' 6"  (1.676 m)   Wt 183 lb (83 kg)   SpO2 98%   BMI 29.54 kg/m   Physical Exam Constitutional:      General: He is not in acute distress.    Appearance: Normal appearance. He is well-developed and normal weight. He is not ill-appearing, toxic-appearing or diaphoretic.  HENT:     Head: Normocephalic and atraumatic.     Right Ear: Tympanic membrane, ear canal and external ear normal. No drainage, swelling or tenderness. No middle ear effusion. There is no impacted cerumen. Tympanic membrane is not erythematous or bulging.     Left Ear: Tympanic membrane, ear canal and external ear normal. No drainage, swelling or tenderness.   No middle ear effusion. There is no impacted cerumen. Tympanic membrane is not erythematous or bulging.     Nose: Nose normal. No congestion or rhinorrhea.     Mouth/Throat:     Mouth: Mucous membranes are moist.     Pharynx: No oropharyngeal exudate or posterior oropharyngeal erythema.  Eyes:     General: No scleral icterus.       Right eye: No discharge.        Left eye: No discharge.     Extraocular Movements: Extraocular movements intact.     Conjunctiva/sclera: Conjunctivae normal.  Cardiovascular:     Rate and Rhythm: Normal rate and regular rhythm.     Heart sounds: Normal heart sounds. No murmur heard.    No friction rub. No gallop.  Pulmonary:     Effort: Pulmonary effort is normal. No respiratory distress.     Breath sounds: Normal breath sounds. No stridor. No wheezing, rhonchi or rales.  Musculoskeletal:     Cervical back: Normal range of motion and neck supple. No rigidity. No muscular tenderness.  Neurological:     General: No focal deficit present.     Mental Status: He is alert and oriented to person, place, and time.     Cranial Nerves: No cranial nerve deficit.     Motor:  No weakness.     Coordination: Coordination normal.     Gait: Gait normal.  Psychiatric:        Mood and Affect: Mood normal.        Behavior: Behavior normal.        Thought Content: Thought content normal.        Judgment: Judgment normal.     Assessment and Plan :   PDMP not reviewed this encounter.  1. Acute viral syndrome   2. Generalized headaches    Deferred imaging given clear cardiopulmonary exam, hemodynamically stable vital signs.  Will manage for viral illness such as viral URI, viral syndrome, viral rhinitis, COVID-19. Recommended supportive care. Offered scripts for symptomatic relief. Testing is pending. Counseled patient on potential for adverse effects with medications prescribed/recommended today, ER and return-to-clinic precautions discussed, patient verbalized  understanding.     Wallis Bamberg, [place] 12/07/23 1151

## 2023-12-07 NOTE — Discharge Instructions (Signed)
 We will notify you of your test results as they arrive and may take between about 24 hours.  I encourage you to sign up for MyChart if you have not already done so as this can be the easiest way for [place] to communicate results to you online or through a phone app.  Generally, we only contact you if it is a positive test result.  In the meantime, if you develop worsening symptoms including fever, chest pain, shortness of breath despite our current treatment plan then please report to the emergency room as this may be a sign of worsening status from possible viral infection.  Otherwise, we will manage this as a viral syndrome. For sore throat or cough try using a honey-based tea. Use 3 teaspoons of honey with juice squeezed from half lemon. Place shaved pieces of ginger into 1/2-1 cup of water and warm over stove top. Then mix the ingredients and repeat every 4 hours as needed. Please take Tylenol 500mg -650mg  every 6 hours for aches and pains, fevers. Hydrate very well with at least 2 liters of water. Eat light meals such as soups to replenish electrolytes and soft fruits, veggies. Start an antihistamine like Zyrtec (10mg  daily) for postnasal drainage, sinus congestion.  You can take this together with pseudoephedrine (Sudafed) at a dose of 30 mg 2-3 times a day as needed for the same kind of congestion.  Use the cough medications as needed.

## 2023-12-07 NOTE — ED Triage Notes (Signed)
 Pt states that he has been having a headache. X2 days Pt denies any other symptoms. Pt states that his employer would like for him to have a PCR covid test.

## 2023-12-08 LAB — SARS CORONAVIRUS 2 (TAT 6-24 HRS): SARS Coronavirus 2: POSITIVE — AB
